# Patient Record
Sex: Female | Born: 1945 | Hispanic: No | Marital: Married | Smoking: Never smoker
Health system: Southern US, Community
[De-identification: ages and names within clinical notes are randomized; demographics above are authoritative.]

## PROBLEM LIST (undated history)

## (undated) DIAGNOSIS — I10 Essential (primary) hypertension: Secondary | ICD-10-CM

## (undated) DIAGNOSIS — I82409 Acute embolism and thrombosis of unspecified deep veins of unspecified lower extremity: Secondary | ICD-10-CM

## (undated) DIAGNOSIS — R51 Headache: Secondary | ICD-10-CM

## (undated) HISTORY — DX: Headache: R51

## (undated) HISTORY — DX: Essential (primary) hypertension: I10

## (undated) HISTORY — PX: THYROIDECTOMY, PARTIAL: SHX18

---

## 2002-01-21 ENCOUNTER — Encounter: Payer: Self-pay | Admitting: Internal Medicine

## 2002-01-21 ENCOUNTER — Encounter: Admission: RE | Admit: 2002-01-21 | Discharge: 2002-01-21 | Payer: Self-pay | Admitting: Internal Medicine

## 2002-02-08 ENCOUNTER — Encounter: Payer: Self-pay | Admitting: Internal Medicine

## 2002-02-08 ENCOUNTER — Encounter: Admission: RE | Admit: 2002-02-08 | Discharge: 2002-02-08 | Payer: Self-pay | Admitting: Internal Medicine

## 2004-12-03 ENCOUNTER — Emergency Department (HOSPITAL_COMMUNITY): Admission: EM | Admit: 2004-12-03 | Discharge: 2004-12-03 | Payer: Self-pay | Admitting: Family Medicine

## 2006-02-11 ENCOUNTER — Emergency Department (HOSPITAL_COMMUNITY): Admission: EM | Admit: 2006-02-11 | Discharge: 2006-02-11 | Payer: Self-pay | Admitting: Emergency Medicine

## 2008-02-09 ENCOUNTER — Emergency Department (HOSPITAL_COMMUNITY): Admission: EM | Admit: 2008-02-09 | Discharge: 2008-02-09 | Payer: Self-pay | Admitting: Emergency Medicine

## 2008-03-28 ENCOUNTER — Emergency Department (HOSPITAL_COMMUNITY): Admission: EM | Admit: 2008-03-28 | Discharge: 2008-03-28 | Payer: Self-pay | Admitting: Family Medicine

## 2009-12-11 IMAGING — CR DG CHEST 2V
2 series · 2 of 2 positions shown · non-contrast
Comparison: None the

CLINICAL DATA: Chest pain

CHEST - 2 VIEW

[w chest pa]
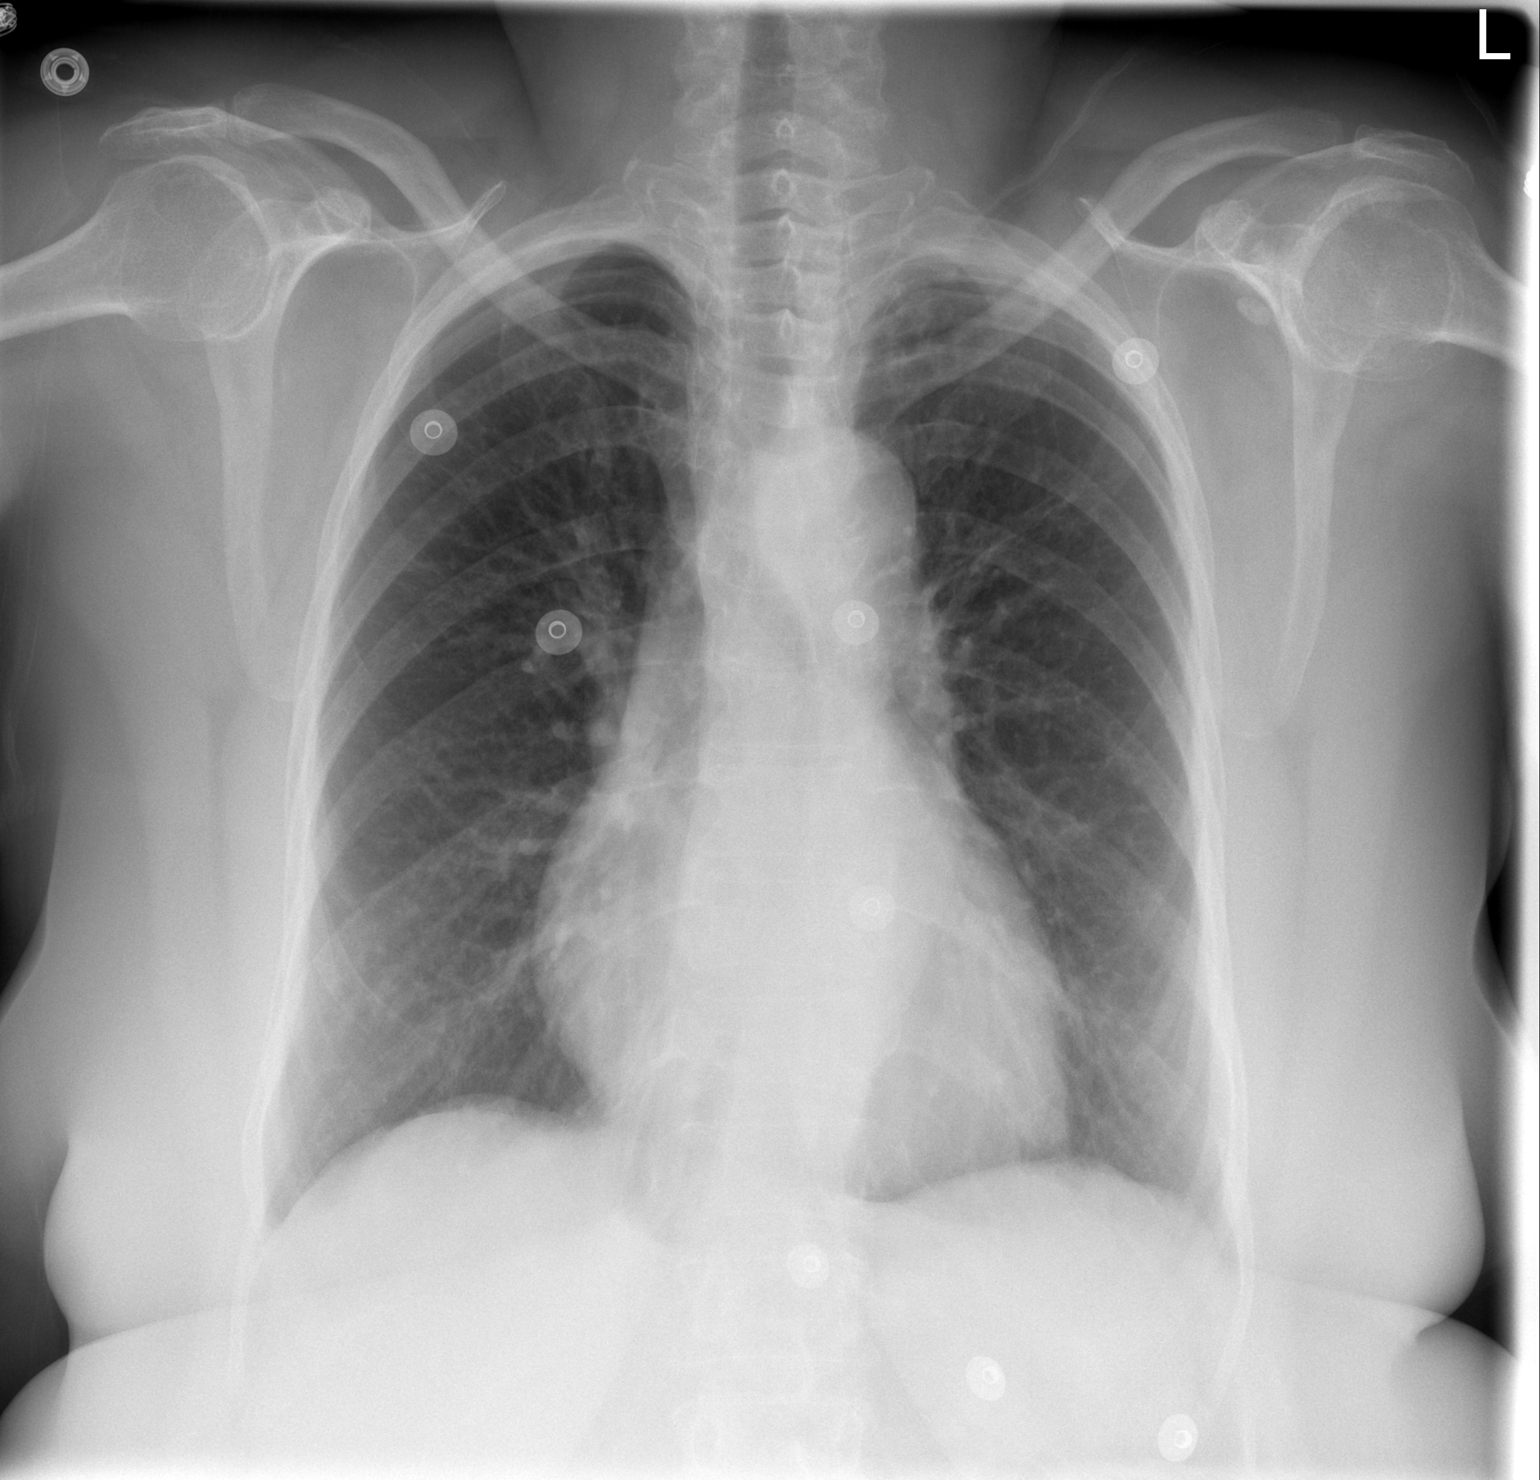

[w chest lat]
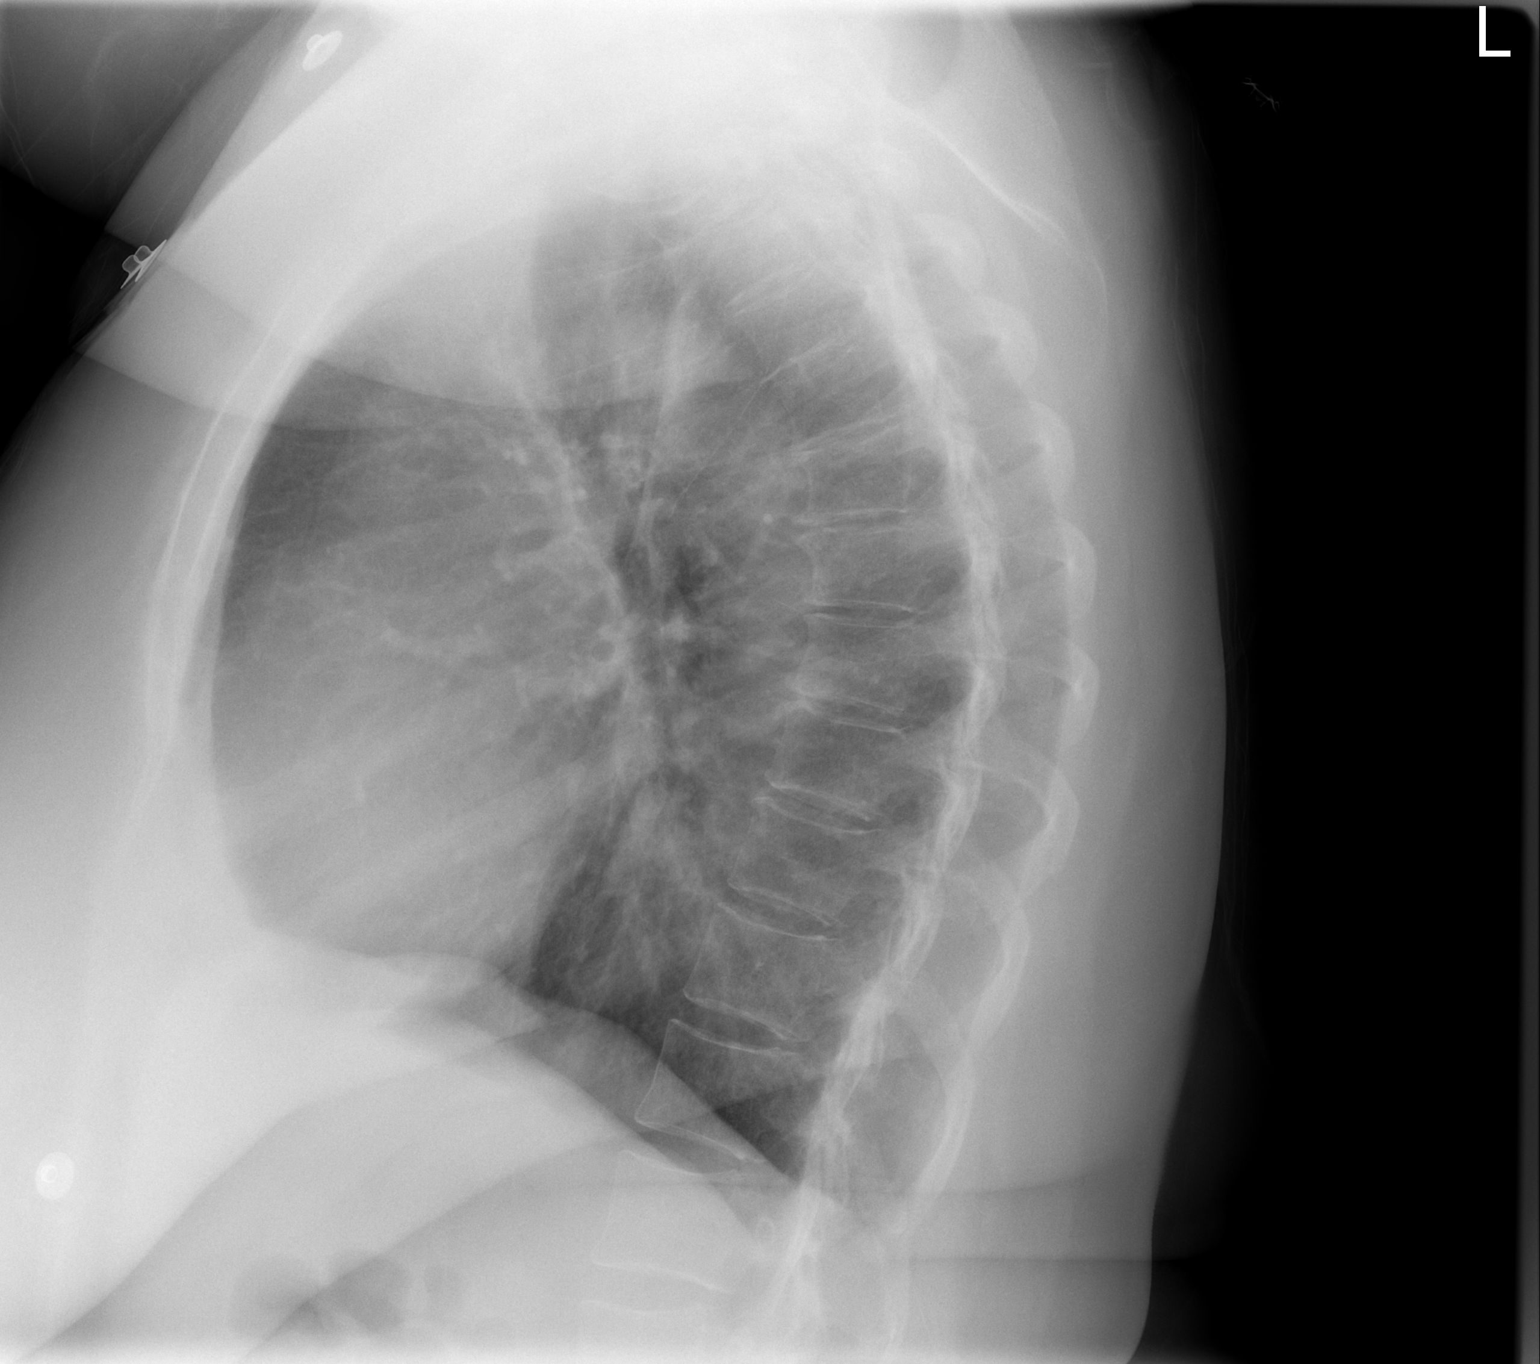

[2 of 2 positions shown; findings below may reference images not displayed]

FINDINGS: Normal mediastinum and cardiac silhouette.  Costophrenic
angles are clear.  No evidence effusion, infiltrate, or
pneumothorax. Bone island in the left scapula
IMPRESSION: No acute cardiopulmonary process.

## 2012-02-14 ENCOUNTER — Other Ambulatory Visit (HOSPITAL_COMMUNITY)
Admission: RE | Admit: 2012-02-14 | Discharge: 2012-02-14 | Disposition: A | Discharge status: Home / Self Care | Payer: Commercial Indemnity | Source: Ambulatory Visit | Attending: Internal Medicine | Admitting: Internal Medicine

## 2012-02-14 DIAGNOSIS — Z1159 Encounter for screening for other viral diseases: Secondary | ICD-10-CM | POA: Insufficient documentation

## 2012-02-14 DIAGNOSIS — Z01419 Encounter for gynecological examination (general) (routine) without abnormal findings: Secondary | ICD-10-CM | POA: Insufficient documentation

## 2013-03-08 ENCOUNTER — Ambulatory Visit: Payer: Commercial Indemnity | Admitting: Neurology

## 2013-05-12 ENCOUNTER — Encounter: Payer: Self-pay | Admitting: Neurology

## 2013-05-12 ENCOUNTER — Ambulatory Visit (INDEPENDENT_AMBULATORY_CARE_PROVIDER_SITE_OTHER): Payer: BC Managed Care – HMO | Admitting: Neurology

## 2013-05-12 VITALS — BP 136/87 | HR 83 | Temp 97.1°F | Ht 59.5 in | Wt 192.0 lb

## 2013-05-12 DIAGNOSIS — M531 Cervicobrachial syndrome: Secondary | ICD-10-CM

## 2013-05-12 DIAGNOSIS — M5481 Occipital neuralgia: Secondary | ICD-10-CM

## 2013-05-12 DIAGNOSIS — R519 Headache, unspecified: Secondary | ICD-10-CM

## 2013-05-12 DIAGNOSIS — R51 Headache: Secondary | ICD-10-CM

## 2013-05-12 NOTE — Progress Notes (Signed)
Subjective:    Patient ID: Cynthia Doyle is a 67 y.o. female.  HPI  Cynthia Foley, MD, PhD [facility] [address] [address] [place], [place] [address]  Dear Dr. Renne Doyle,   I saw your patient, Cynthia Doyle, upon your kind request in my neurologic clinic today for initial consultation of potential trigeminal neuralgia. The patient is accompanied by her husband and son today. As you know, Cynthia Doyle is a very pleasant-year-old lady with a underlying medical history of HTN, and thyroid nodule, s/p partial thyroidectomy, who has a 1 year hx of R posterior neck pain and posterior headache, retroauricular pain on the R, constant, non-throbbing, no photophobia, no hearing loss, no double vision, no N/V, describing a deep, boring pain, sensitive to touch. She was tried on carbamazepine for about 2 weeks, then Dr. Dareen Doyle in Rheumatology and was switched to prednisone 60mg  daily, which she reduce per your advise to 50 mg, but in the last 3 days, she has been taking only 5 mg daily. She has developed facial swelling since the prednisone. She has no ear pain, no tinnitus, no swallowing pain or difficulty. She had blood work done and I am going to request copies from your office. She describes a less severe pain now, 5/10 and it responds to OTC NSAIDs at this point.    Her Past Medical History Is Significant For: Past Medical History  Diagnosis Date  . HTN (hypertension)     Her Past Surgical History Is Significant For: Past Surgical History  Procedure Laterality Date  . Thyroidectomy, partial      Her Family History Is Significant For: History reviewed. No pertinent family history.  Her Social History Is Significant For: History   Social History  . Marital Status: Married    Spouse Name: N/A    Number of Children: N/A  . Years of Education: N/A   Social History Main Topics  . Smoking status: Never Smoker   . Smokeless tobacco: None  . Alcohol Use: No  . Drug  Use: No  . Sexually Active: None   Other Topics Concern  . None   Social History Narrative  . None    Her Allergies Are:  Allergies  Allergen Reactions  . Prednisone Swelling  :   Her Current Medications Are:  Outpatient Encounter Prescriptions as of 05/12/2013  Medication Sig Dispense Refill  . lisinopril-hydrochlorothiazide (PRINZIDE,ZESTORETIC) 20-25 MG per tablet Take 1 tablet by mouth daily.      . predniSONE (DELTASONE) 20 MG tablet Take 20 mg by mouth daily.       No facility-administered encounter medications on file as of 05/12/2013.    Review of Systems  Constitutional: Positive for unexpected weight change.  Cardiovascular: Positive for leg swelling.  Musculoskeletal: Positive for joint swelling and arthralgias.    Objective:  Neurologic Exam  Physical Exam Physical Examination:   Filed Vitals:   05/12/13 1023  BP: 136/87  Pulse: 83  Temp: 97.1 F (36.2 C)    General Examination: The patient is a very pleasant 67 y.o. female in no acute distress. She appears well-developed and well-nourished and well groomed. She is overweight.  HEENT: Normocephalic, atraumatic, pupils are equal, round and reactive to light and accommodation. Funduscopic exam is normal with sharp disc margins noted. Extraocular tracking is good without limitation to gaze excursion or nystagmus noted. Normal smooth pursuit is noted. Hearing is grossly intact. Tympanic membranes are clear bilaterally. Face is symmetric with normal facial  animation and normal facial sensation. Speech is clear with no dysarthria noted. There is no hypophonia. There is no lip, neck/head, jaw or voice tremor. Neck is supple with full range of passive and active motion. She has a tenderness to deep palpation in the R post neck, behind the R ear and in the back portion of the head on the R. No temporal tenderness. There are no carotid bruits on auscultation. Oropharynx exam reveals: mild mouth dryness, adequate dental  hygiene and moderate airway crowding, due to elongated tongue and narrow airway. Mallampati is class II. Tongue protrudes centrally and palate elevates symmetrically.   Chest: Clear to auscultation without wheezing, rhonchi or crackles noted.  Heart: S1+S2+0, regular and normal without murmurs, rubs or gallops noted.   Abdomen: Soft, non-tender and non-distended with normal bowel sounds appreciated on auscultation.  Extremities: There is no pitting edema in the distal lower extremities bilaterally, but has bilateral ankle puffiness, non-pitting. Pedal pulses are intact.  Skin: Warm and dry without trophic changes noted. There are no varicose veins.  Musculoskeletal: exam reveals no obvious joint deformities, tenderness or joint swelling or erythema.   Neurologically:  Mental status: The patient is awake, alert and oriented in all 4 spheres. Her memory, attention, language and knowledge are appropriate. There is no aphasia, agnosia, apraxia or anomia. Speech is clear with normal prosody and enunciation. Thought process is linear. Mood is congruent and affect is normal.  Cranial nerves are as described above under HEENT exam. In addition, shoulder shrug is normal with equal shoulder height noted. Motor exam: Normal bulk, strength and tone is noted. There is no drift, tremor or rebound. Romberg is negative. Reflexes are 2+ throughout. Toes are downgoing bilaterally. Fine motor skills are intact with normal finger taps, normal hand movements, normal rapid alternating patting, normal foot taps and normal foot agility.  Cerebellar testing shows no dysmetria or intention tremor on finger to nose testing. Heel to shin is unremarkable bilaterally. There is no truncal or gait ataxia.  Sensory exam is intact to light touch, pinprick, vibration, temperature sense and proprioception in the upper and lower extremities.  Gait, station and balance are unremarkable. No veering to one side is noted. No leaning to  one side is noted. Posture is age-appropriate and stance is narrow based. No problems turning are noted. She turns en bloc. Tandem walk is unremarkable. Intact toe and heel stance is noted.               Assessment and Plan:   Assessment and Plan:  In summary, Tecla M Saathoff is a very pleasant 67 y.o.-year old female with a history of R sided posterior head and neck pain. This is not a migrainous HA, and seems to be more in keeping with occipital neuralgia. Her physical exam is otherwise non-focal and I reassured the patient in that regard.  I had a long chat with the patient about my findings and the diagnosis of neuralgic pain, the prognosis and treatment options. We talked about medical treatments and non-pharmacological approaches.    As far as further diagnostic testing is concerned, I suggested the following today: MRI brain w and w/o Gad, MRI neck and MRA neck.  As far as medications are concerned, I recommended the following at this time: we can consider a trial of gabapentin down the road, but at this point, she has fairly good success with prn use of NSAIDs.  I answered all her questions today and the patient was in agreement  with the above outlined plan. I would like to see the patient back in 3 months, sooner if the need arises and encouraged her to call with any interim questions, concerns, problems or updates and test results.   Thank you very much for allowing me to participate in the care of this nice patient. If I can be of any further assistance to you please do not hesitate to call me at [phone].  Sincerely,   Cynthia Foley, MD, PhD

## 2013-05-12 NOTE — Patient Instructions (Addendum)
I think overall you are doing fairly well but I do want to suggest a few things today:  Please remember, common headache triggers are: sleep deprivation, dehydration, overheating, stress, hypoglycemia or skipping meals, excessive pain medications or excessive alcohol use. Some people have food triggers such as aged cheese, orange juice or chocolate, especially dark chocolate. Try to avoid these headache triggers as much possible. It may be helpful to keep a headache diary to figure out what makes your headaches worse or brings them on. Some people report headache onset after exercise but studies have shown that regular exercise may actually prevent headaches from coming on. If you have exercise-induced headaches, please make sure that you drink plenty of fluid before and after exercising and that you don't over do it.  As far as your medications are concerned, I would like to suggest no new medication at this point, we may try gabapentin down the road.    As far as diagnostic testing: MRI brain and neck, MRA head and neck  I would like to see you back in 3 months, sooner if we need to. Please call us with any interim questions, concerns, problems, updates or refill requests.  Please also call us for any test results so we can go over those with you on the phone. Brett Canales is my clinical assistant and will answer any of your questions and relay your messages to me and also relay most of my messages to you.  Our phone number is [phone]. We also have an after hours call service for urgent matters and there is a physician on-call for urgent questions. For any emergencies you know to call 911 or go to the nearest emergency room.

## 2013-05-26 ENCOUNTER — Ambulatory Visit (INDEPENDENT_AMBULATORY_CARE_PROVIDER_SITE_OTHER): Payer: BC Managed Care – HMO

## 2013-05-26 DIAGNOSIS — R51 Headache: Secondary | ICD-10-CM

## 2013-05-26 DIAGNOSIS — M5481 Occipital neuralgia: Secondary | ICD-10-CM

## 2013-05-26 DIAGNOSIS — R519 Headache, unspecified: Secondary | ICD-10-CM

## 2013-05-26 DIAGNOSIS — M531 Cervicobrachial syndrome: Secondary | ICD-10-CM

## 2013-05-27 MED ORDER — GADOPENTETATE DIMEGLUMINE 469.01 MG/ML IV SOLN: 20.0000 mL | Freq: Once | INTRAVENOUS | Status: AC | PRN

## 2013-05-28 NOTE — Progress Notes (Signed)
Quick Note:  Please advise patient that her recent scan results are in. The blood vessels in her head and her neck looked fine. Her brain scan showed mild age-related changes and 1 small area of abnormality in the left front of the brain - this is most likely a benign tumor called a meningioma. This is not a cancerous tumor or anything to particularly worry about. I do not believe it explains her headaches which are on the back side of her head. Nevertheless I think it would be a good idea for her to see a neurosurgeon just to get a consultation about this. Again this is not a cancerous tumor and there is no or minimal swelling around it. This is typically something we will monitor her by doing a repeat MRI down the Road. And again I do not believe it is a good explanation for her headaches. He she gives [place] the go ahead I would like to put in a referral to neurosurgery to get an opinion from the surgeon. Please advise me back after you speak with the patient, thx Huston Foley, MD, PhD [facility]  ______

## 2013-05-31 ENCOUNTER — Telehealth: Payer: Self-pay | Admitting: Neurology

## 2013-06-01 ENCOUNTER — Telehealth: Payer: Self-pay | Admitting: *Deleted

## 2013-06-01 NOTE — Telephone Encounter (Signed)
Called patient to let her know all  Her Scans has been sent to Dr. Frances Furbish to read.

## 2013-06-01 NOTE — Telephone Encounter (Signed)
Message copied by Salome Spotted on Tue Jun 01, 2013  9:47 AM ------      Message from: Huston Foley      Created: Fri May 28, 2013  3:20 PM       Please advise patient that her recent scan results are in. The blood vessels in her head and her neck looked fine. Her brain scan showed mild age-related changes and 1 small area of abnormality in the left front of the brain -  this is most likely a benign tumor called a meningioma. This is not a cancerous tumor or anything to particularly worry about. I do not believe it explains her headaches which are on the back side of her head. Nevertheless I think it would be a good idea for her to see a neurosurgeon just to get a consultation about this. Again this is not a cancerous tumor and there is no or minimal swelling around it. This is typically something we will monitor her by doing a repeat MRI down the Road. And again I do not believe it is a good explanation for her headaches. He she gives [place] the go ahead I would like to put in a referral to neurosurgery to get an opinion from the surgeon. Please advise me back after you speak with the patient, thx      Huston Foley, MD, PhD      [facility]       ------

## 2013-06-01 NOTE — Telephone Encounter (Signed)
Patient was ok with results she will get the second opinion from the Neurosurgeon.

## 2013-06-03 ENCOUNTER — Other Ambulatory Visit: Payer: Self-pay | Admitting: *Deleted

## 2013-06-03 ENCOUNTER — Encounter: Payer: Self-pay | Admitting: *Deleted

## 2013-06-03 DIAGNOSIS — M531 Cervicobrachial syndrome: Secondary | ICD-10-CM

## 2013-06-03 DIAGNOSIS — D329 Benign neoplasm of meninges, unspecified: Secondary | ICD-10-CM

## 2013-06-03 MED ORDER — GABAPENTIN 100 MG PO CAPS: 100.0000 mg | ORAL_CAPSULE | Freq: Every day | ORAL | Status: DC

## 2013-06-03 NOTE — Telephone Encounter (Signed)
This encounter was created in error - please disregard.

## 2013-06-09 ENCOUNTER — Telehealth: Payer: Self-pay | Admitting: Neurology

## 2013-06-09 NOTE — Telephone Encounter (Signed)
Patient was calling to see if referral was made for neuro-surgeon. I reviewed the referral and see that the referral has been made and approved for one visit on June 03 2013. She should hear from them within about a week.

## 2013-06-28 ENCOUNTER — Telehealth: Payer: Self-pay | Admitting: Neurology

## 2013-07-01 NOTE — Telephone Encounter (Signed)
Order placed to referrals, D. Handoga

## 2013-07-27 ENCOUNTER — Other Ambulatory Visit: Payer: Self-pay

## 2013-07-27 DIAGNOSIS — M531 Cervicobrachial syndrome: Secondary | ICD-10-CM

## 2013-07-27 MED ORDER — GABAPENTIN 100 MG PO CAPS: 100.0000 mg | ORAL_CAPSULE | Freq: Every day | ORAL | Status: DC

## 2013-08-05 ENCOUNTER — Telehealth: Payer: Self-pay | Admitting: *Deleted

## 2013-08-06 ENCOUNTER — Other Ambulatory Visit: Payer: Self-pay

## 2013-08-06 DIAGNOSIS — M531 Cervicobrachial syndrome: Secondary | ICD-10-CM

## 2013-08-06 MED ORDER — GABAPENTIN 100 MG PO CAPS: 100.0000 mg | ORAL_CAPSULE | Freq: Every day | ORAL | Status: DC

## 2013-08-06 NOTE — Telephone Encounter (Signed)
Patient stated that she needs a 90 day supply of gabapentin. Her insurance will not allow after 3rd order without having to pay full price.

## 2013-08-06 NOTE — Telephone Encounter (Signed)
I called patient. I will resend script for 90 days.

## 2013-09-09 ENCOUNTER — Encounter: Payer: Self-pay | Admitting: Neurology

## 2013-09-09 ENCOUNTER — Ambulatory Visit (INDEPENDENT_AMBULATORY_CARE_PROVIDER_SITE_OTHER): Payer: BC Managed Care – HMO | Admitting: Neurology

## 2013-09-09 VITALS — BP 136/95 | HR 77 | Temp 98.2°F | Ht 59.5 in | Wt 196.0 lb

## 2013-09-09 DIAGNOSIS — M6530 Trigger finger, unspecified finger: Secondary | ICD-10-CM

## 2013-09-09 DIAGNOSIS — D320 Benign neoplasm of cerebral meninges: Secondary | ICD-10-CM

## 2013-09-09 DIAGNOSIS — G4486 Cervicogenic headache: Secondary | ICD-10-CM

## 2013-09-09 DIAGNOSIS — D329 Benign neoplasm of meninges, unspecified: Secondary | ICD-10-CM

## 2013-09-09 DIAGNOSIS — R51 Headache: Secondary | ICD-10-CM

## 2013-09-09 DIAGNOSIS — M531 Cervicobrachial syndrome: Secondary | ICD-10-CM

## 2013-09-09 HISTORY — DX: Cervicogenic headache: G44.86

## 2013-09-09 MED ORDER — GABAPENTIN 100 MG PO CAPS: 100.0000 mg | ORAL_CAPSULE | Freq: Every day | ORAL | Status: DC

## 2013-09-09 NOTE — Progress Notes (Signed)
Subjective:    Patient ID: Cynthia Doyle is a 67 y.o. female.  HPI    Interim history:   Cynthia Doyle is a very pleasant 67 year-old lady with a underlying medical history of HTN, and thyroid nodule, s/p partial thyroidectomy, who presents for followup consultation of her recurrent headaches. She is unaccompanied today. I first met her on 05/12/2013, at which time I suggested further workup in the form of MRA head and neck, as well as MRI brain w/wo contrast. She reported at the time a 1 year hx of R posterior neck pain and posterior headache, retroauricular pain on the R, constant, non-throbbing, no photophobia, no hearing loss, no double vision, no N/V, describing a deep, boring pain, sensitive to touch. She was tried on carbamazepine for about 2 weeks, then saw Dr. Dareen Piano in Rheumatology and was switched to prednisone 60mg  daily, which she reduced 50 mg, then 5 mg daily. She developed facial swelling from the prednisone. She had no ear pain, no tinnitus, no swallowing pain or difficulty.  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 Her MRI brain showed mild changes of chronic microvascular ischemia. A calcified left frontal parasagittal 2 x 1.5 x 1 cm lesion likely representing a meningioma. Based on this finding I suggested a consultation with neurosurgery and made a referral in that regard. She saw Dr. Dutch Quint who suggested a 1 year FU.  I did not think the meningioma was the cause of her headaches, however. For symptomatic treatment of her headaches I suggested a trial of Neurontin, which she is now taking at 100 mg qHS and feels much improved with no SE reported. She has developed a trigger finger in her R ring finger. She has no other new complaints.   Her Past Medical History Is Significant For: Past Medical History  Diagnosis Date  . HTN  (hypertension)     Her Past Surgical History Is Significant For: Past Surgical History  Procedure Laterality Date  . Thyroidectomy, partial      Her Family History Is Significant For:  She has a special needs son, now 53 yo  Her Social History Is Significant For: History   Social History  . Marital Status: Married    Spouse Name: N/A    Number of Children: N/A  . Years of Education: N/A   Social History Main Topics  . Smoking status: Never Smoker   . Smokeless tobacco: None  . Alcohol Use: No  . Drug Use: No  . Sexual Activity: None   Other Topics Concern  . None   Social History Narrative  . None    Her Allergies Are:  Allergies  Allergen Reactions  . Prednisone Swelling  :   Her Current Medications Are:  Outpatient Encounter Prescriptions as of 09/09/2013  Medication Sig  . gabapentin (NEURONTIN) 100 MG capsule Take 1 capsule (100 mg total) by mouth at bedtime.  Marland Kitchen lisinopril-hydrochlorothiazide (PRINZIDE,ZESTORETIC) 20-25 MG per tablet Take 1 tablet by mouth daily.  . [DISCONTINUED] predniSONE (DELTASONE) 20 MG tablet Take 20 mg by mouth daily.   Review of Systems:  Out of a complete 14 point review of systems, all are reviewed and negative with the exception of these symptoms as listed below:    Review of Systems  Constitutional: Negative.   HENT: Negative.   Eyes: Negative.   Respiratory: Negative.  Cardiovascular: Negative.   Gastrointestinal: Negative.   Endocrine: Negative.   Genitourinary: Negative.   Musculoskeletal: Positive for joint swelling.  Skin: Negative.   Allergic/Immunologic: Negative.   Neurological: Negative.   Hematological: Negative.   Psychiatric/Behavioral: Negative.     Objective:  Neurologic Exam  Physical Exam Physical Examination:   Filed Vitals:   09/09/13 1146  BP: 136/95  Pulse: 77  Temp: 98.2 F (36.8 C)   General Examination: The patient is a very pleasant 67 y.o. female in no acute distress. She  appears well-developed and well-nourished and well groomed. She is overweight. She is in good spirits today.  HEENT: Normocephalic, atraumatic, pupils are equal, round and reactive to light and accommodation. Funduscopic exam is normal with sharp disc margins noted. Extraocular tracking is good without limitation to gaze excursion or nystagmus noted. Normal smooth pursuit is noted. Hearing is grossly intact. Face is symmetric with normal facial animation and normal facial sensation. Speech is clear with no dysarthria noted. There is no hypophonia. There is no lip, neck/head, jaw or voice tremor. Neck is supple with full range of passive and active motion. She has no more tenderness to deep palpation in the R post neck, behind the R ear and in the back portion of the head on the R. No temporal tenderness. There are no carotid bruits on auscultation. Oropharynx exam reveals: mild mouth dryness, adequate dental hygiene and moderate airway crowding, due to elongated tongue and narrow airway. Mallampati is class II. Tongue protrudes centrally and palate elevates symmetrically.   Chest: Clear to auscultation without wheezing, rhonchi or crackles noted.  Heart: S1+S2+0, regular and normal without murmurs, rubs or gallops noted.   Abdomen: Soft, non-tender and non-distended with normal bowel sounds appreciated on auscultation.  Extremities: There is no pitting edema in the distal lower extremities bilaterally, but has bilateral ankle puffiness, non-pitting, R more L. Pedal pulses are intact. She has evidence of right ring finger trigger finger.   Skin: Warm and dry without trophic changes noted. There are no varicose veins.  Musculoskeletal: exam reveals no obvious joint deformities, tenderness or joint swelling or erythema.   Neurologically:  Mental status: The patient is awake, alert and oriented in all 4 spheres. Her memory, attention, language and knowledge are appropriate. There is no aphasia, agnosia,  apraxia or anomia. Speech is clear with normal prosody and enunciation. Thought process is linear. Mood is congruent and affect is normal.  Cranial nerves are as described above under HEENT exam. In addition, shoulder shrug is normal with equal shoulder height noted. Motor exam: Normal bulk, strength and tone is noted. There is no drift, tremor or rebound. Romberg is negative. Reflexes are 2+ throughout. Toes are downgoing bilaterally. Fine motor skills are intact with normal finger taps, normal hand movements, normal rapid alternating patting, normal foot taps and normal foot agility.  Cerebellar testing shows no dysmetria or intention tremor on finger to nose testing. There is no truncal or gait ataxia.  Sensory exam is intact to light touch and PP in the upper and lower extremities.  Gait, station and balance are unremarkable, except for slow walk d/t body habitus. No veering to one side is noted. No leaning to one side is noted. Posture is age-appropriate and stance is narrow based. No problems turning are noted. She turns en bloc.   Assessment and Plan:   In summary, Cynthia Doyle is a very pleasant 67 y.o.-year old female with a history of R sided posterior head and  neck pain. This is not a migrainous HA, and seems to be more in keeping with occipital neuralgia versus cervicogenic headache. Her physical exam continues to be benign. She does have evidence of a left ring finger trigger finger. I explained to her that if that gets worse or starts bothering her more she may have to see a hand surgeon. Otherwise she is stable and improved with the use of gabapentin regarding her headache. She has no further tenderness in her right occipital and retroauricular areas. She has a new diagnosis of a meningioma for which she has seen neurosurgery and was advised to return in one year. I explained to her that I do not believe her meningioma is the cause of her headache or occipital pain and she was asking if the  trigger finger was the result of gabapentin which I explained to her is not likely connected. She has no side effects with gabapentin. She does not see a need to increase it and I would like to keep it at the low dose. I renewed her prescription. Today we talked about her MRI and MRA results.  I answered all her questions today and the patient was advised to return in 6 months routinely, sooner if the need arises and encouraged her to call with any interim questions, concerns, problems or updates and test results.

## 2013-09-09 NOTE — Patient Instructions (Signed)
I think overall you are doing fairly well and are stable at this point.   I do have some generic suggestions for you today:  Please make sure that you drink plenty of fluids. I would like for you to exercise daily for example in the form of walking 20-30 minutes every day, if you can. Please keep a regular sleep-wake schedule, keep regular meal times, do not skip any meals, eat  healthy snacks in between meals, such as fruit or nuts. Try to eat protein with every meal.   As far as your medications are concerned, I would like to suggest: No changes, continue with gabapentin at night. I renewed her prescription.  As far as diagnostic testing, I recommend: no new test, but for your trigger finger you may have to see a hand surgeon if it does not get better or especially if it gets worse.  Engage in social activities in your community and with your family and try to keep up with current events by reading the newspaper or watching the news.  I do not think we need to make any changes in your medications at this point. I think you're stable enough that I can see you back in 6 months, sooner if we need to. Please call us if you have any interim questions, concerns, or problems or updates to need to discuss.  Brett Canales is my clinical assistant and will answer any of your questions and relay your messages to me and will give you my messages.   Our phone number is [phone]. We also have an after hours call service for urgent matters and there is a physician on-call for urgent questions. For any emergencies you know to call 911 or go to the nearest emergency room.

## 2014-01-18 ENCOUNTER — Telehealth: Payer: Self-pay | Admitting: *Deleted

## 2014-01-18 DIAGNOSIS — R519 Headache, unspecified: Secondary | ICD-10-CM

## 2014-01-18 DIAGNOSIS — M6530 Trigger finger, unspecified finger: Secondary | ICD-10-CM

## 2014-01-18 DIAGNOSIS — D329 Benign neoplasm of meninges, unspecified: Secondary | ICD-10-CM

## 2014-01-18 DIAGNOSIS — R51 Headache: Secondary | ICD-10-CM

## 2014-01-18 DIAGNOSIS — G4486 Cervicogenic headache: Secondary | ICD-10-CM

## 2014-01-18 DIAGNOSIS — M531 Cervicobrachial syndrome: Secondary | ICD-10-CM

## 2014-01-18 MED ORDER — GABAPENTIN 100 MG PO CAPS: 300.0000 mg | ORAL_CAPSULE | Freq: Every day | ORAL | Status: AC

## 2014-01-18 NOTE — Telephone Encounter (Signed)
Please advise patient that we can gradually increase the gabapentin. She can take 2 pills each night for the next 2 weeks and then increase it to 3 pills each night. She is on a very low dose and I started her on a cautious dose.

## 2014-01-18 NOTE — Telephone Encounter (Signed)
Spoke with patient and she said that the medicationgabapentin (NEURONTIN) 100 MG capsule is not helping, still having headaches every night. The right side starting with the neck and behind the ear.Is there anything else she may try? -

## 2014-01-18 NOTE — Telephone Encounter (Signed)
I called pt and let her know recommendations as per below.  She will try.  Has f/u appt 03-10-14.  Will call back as needed.   She verbalized understanding.

## 2014-03-10 ENCOUNTER — Ambulatory Visit: Payer: BC Managed Care – HMO | Admitting: Neurology

## 2014-03-18 DIAGNOSIS — D72819 Decreased white blood cell count, unspecified: Secondary | ICD-10-CM | POA: Diagnosis not present

## 2014-03-18 DIAGNOSIS — I10 Essential (primary) hypertension: Secondary | ICD-10-CM | POA: Diagnosis not present

## 2014-03-18 DIAGNOSIS — Z0000 Encounter for general adult medical examination without abnormal findings: Secondary | ICD-10-CM | POA: Diagnosis not present

## 2014-03-24 DIAGNOSIS — Z23 Encounter for immunization: Secondary | ICD-10-CM | POA: Diagnosis not present

## 2014-03-24 DIAGNOSIS — D72819 Decreased white blood cell count, unspecified: Secondary | ICD-10-CM | POA: Diagnosis not present

## 2014-03-24 DIAGNOSIS — I10 Essential (primary) hypertension: Secondary | ICD-10-CM | POA: Diagnosis not present

## 2014-03-24 DIAGNOSIS — G500 Trigeminal neuralgia: Secondary | ICD-10-CM | POA: Diagnosis not present

## 2014-03-24 DIAGNOSIS — M316 Other giant cell arteritis: Secondary | ICD-10-CM | POA: Diagnosis not present

## 2014-07-18 ENCOUNTER — Ambulatory Visit: Payer: BC Managed Care – HMO | Admitting: Neurology

## 2014-09-20 DIAGNOSIS — M5481 Occipital neuralgia: Secondary | ICD-10-CM | POA: Diagnosis not present

## 2014-09-21 DIAGNOSIS — R220 Localized swelling, mass and lump, head: Secondary | ICD-10-CM | POA: Diagnosis not present

## 2014-09-21 DIAGNOSIS — D329 Benign neoplasm of meninges, unspecified: Secondary | ICD-10-CM | POA: Diagnosis not present

## 2014-09-22 DIAGNOSIS — R749 Abnormal serum enzyme level, unspecified: Secondary | ICD-10-CM | POA: Diagnosis not present

## 2014-09-22 DIAGNOSIS — M5481 Occipital neuralgia: Secondary | ICD-10-CM | POA: Diagnosis not present

## 2014-09-27 DIAGNOSIS — G500 Trigeminal neuralgia: Secondary | ICD-10-CM | POA: Diagnosis not present

## 2014-09-27 DIAGNOSIS — R7309 Other abnormal glucose: Secondary | ICD-10-CM | POA: Diagnosis not present

## 2014-09-27 DIAGNOSIS — I10 Essential (primary) hypertension: Secondary | ICD-10-CM | POA: Diagnosis not present

## 2014-11-14 ENCOUNTER — Ambulatory Visit (INDEPENDENT_AMBULATORY_CARE_PROVIDER_SITE_OTHER): Payer: Medicare Other | Admitting: Neurology

## 2014-11-14 ENCOUNTER — Encounter: Payer: Self-pay | Admitting: Neurology

## 2014-11-14 VITALS — BP 159/90 | HR 69 | Temp 97.2°F | Ht 59.5 in | Wt 187.0 lb

## 2014-11-14 DIAGNOSIS — G4486 Cervicogenic headache: Secondary | ICD-10-CM

## 2014-11-14 DIAGNOSIS — R51 Headache: Secondary | ICD-10-CM | POA: Diagnosis not present

## 2014-11-14 DIAGNOSIS — M25512 Pain in left shoulder: Secondary | ICD-10-CM | POA: Diagnosis not present

## 2014-11-14 DIAGNOSIS — I10 Essential (primary) hypertension: Secondary | ICD-10-CM | POA: Diagnosis not present

## 2014-11-14 DIAGNOSIS — D329 Benign neoplasm of meninges, unspecified: Secondary | ICD-10-CM | POA: Diagnosis not present

## 2014-11-14 NOTE — Patient Instructions (Addendum)
Neurologically, you look well. You can follow up with me as needed.   Please remember, common headache triggers are: sleep deprivation, dehydration, overheating, stress, hypoglycemia or skipping meals and blood sugar fluctuations, excessive pain medications or excessive alcohol use or caffeine withdrawal. Some people have food triggers such as aged cheese, orange juice or chocolate, especially dark chocolate, or MSG (monosodium glutamate). Try to avoid these headache triggers as much possible. It may be helpful to keep a headache diary to figure out what makes your headaches worse or brings them on and what alleviates them. Some people report headache onset after exercise but studies have shown that regular exercise may actually prevent headaches from coming. If you have exercise-induced headaches, please make sure that you drink plenty of fluid before and after exercising and that you do not over do it and do not overheat.  For your left shoulder pain, please see your primary doctor or go to urgent care. You may have injured your rotator cuff.

## 2014-11-14 NOTE — Progress Notes (Signed)
Subjective:    Patient ID: Cynthia Doyle is a 69 y.o. female.  HPI     Interim history:  Cynthia Doyle is a very pleasant 69 year-old lady with a underlying medical history of HTN, and thyroid nodule, s/p partial thyroidectomy, who presents for followup consultation of her recurrent headaches. She is unaccompanied today. I last saw her on 09/09/2013, at which time I suggested she continue low-dose gabapentin. She called on 01/18/2014 reporting that she had worsening of her headaches. I suggested she increase gabapentin to 300 mg gradually.  Today, she reports that she stays in [facility] for 6 months and her husband (61 yo) lives there. She has 4 children of her own, 3 live in the area and one in [place]. She has been doing better with her headaches. She takes gabapentin prn, 1-2 pills a night, none in the last 1 1/2 weeks. She had another brain MRI in 12/15, per Dr. Trenton Gammon, which was stable, per patient, but I do not have the results available for review. She has been having L shoulder pain for the past 5 days with decrease in ROM. She has been using a heat pad and ibuprofen. She was working quite hard physically when she was in [place] as they had a lot of damage to their home and a lot of work to do. She was cleaning up a lot. She had lost weight because of her physical activity there. She regained some weight when she came back here.  I first met her on 05/12/2013, at which time I suggested further workup in the form of MRA head and neck, as well as MRI brain w/wo contrast. She reported at the time a 1 year hx of R posterior neck pain and posterior headache, retroauricular pain on the R, constant, non-throbbing, no photophobia, no hearing loss, no double vision, no N/V, describing a deep, boring pain, sensitive to touch. She was tried on carbamazepine for about 2 weeks, then saw Dr. Ouida Sills in Rheumatology and was switched to prednisone 31m daily, which she reduced 50 mg, then 5 mg daily. She developed facial  swelling from the prednisone. She had no ear pain, no tinnitus, no swallowing pain or difficulty.   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 Her MRI brain showed mild changes of chronic microvascular ischemia. A calcified left frontal parasagittal 2 x 1.5 x 1 cm lesion likely representing a meningioma. Based on this finding I suggested a consultation with neurosurgery and made a referral in that regard. She saw Dr. PTrenton Gammonwho suggested a 1 year FU.   I did not think the meningioma was the cause of her headaches, however. For symptomatic treatment of her headaches I suggested a trial of Neurontin.   Her Past Medical History Is Significant For: Past Medical History  Diagnosis Date  . HTN (hypertension)   . Cervicogenic headache 09/09/2013    Her Past Surgical History Is Significant For: Past Surgical History  Procedure Laterality Date  . Thyroidectomy, partial      Her Family History Is Significant For: Family History  Problem Relation Age of Onset  . Healthy Sister   . Healthy Brother     Her Social History Is Significant For: History   Social History  . Marital Status: Married    Spouse Name: N/A    Number of Children: N/A  .  Years of Education: N/A   Social History Main Topics  . Smoking status: Never Smoker   . Smokeless tobacco: None  . Alcohol Use: No  . Drug Use: No  . Sexual Activity: None   Other Topics Concern  . None   Social History Narrative    Her Allergies Are:  Allergies  Allergen Reactions  . Prednisone Swelling  :   Her Current Medications Are:  Outpatient Encounter Prescriptions as of 11/14/2014  Medication Sig  . gabapentin (NEURONTIN) 100 MG capsule Take 3 capsules (300 mg total) by mouth at bedtime.  Marland Kitchen lisinopril-hydrochlorothiazide (PRINZIDE,ZESTORETIC) 20-25 MG per tablet Take 1 tablet by  mouth daily.  :  Review of Systems:  Out of a complete 14 point review of systems, all are reviewed and negative with the exception of these symptoms as listed below:   Review of Systems L shoulder pain, used heat pad, ibuprofen.   Objective:  Neurologic Exam  Physical Exam Physical Examination:   Filed Vitals:   11/14/14 1419  BP: 159/90  Pulse: 69  Temp:    General Examination: The patient is a very pleasant 69 y.o. female in no acute distress. She appears well-developed and well-nourished and well groomed. She is overweight. She is in good spirits today, but complains of left shoulder pain.  HEENT: Normocephalic, atraumatic, pupils are equal, round and reactive to light and accommodation. Funduscopic exam is normal with sharp disc margins noted. Extraocular tracking is good without limitation to gaze excursion or nystagmus noted. Normal smooth pursuit is noted. She has mild bilateral cataracts. Hearing is grossly intact. Face is symmetric with normal facial animation and normal facial sensation. Speech is clear with no dysarthria noted. There is no hypophonia. There is no lip, neck/head, jaw or voice tremor. Neck is supple with full range of passive and active motion. She has no tenderness to deep palpation in the neck or base of skull. No temporal tenderness. There are no carotid bruits on auscultation. Oropharynx exam reveals: mild mouth dryness, adequate dental hygiene and moderate airway crowding, due to elongated tongue and narrow airway. Mallampati is class II. Tongue protrudes centrally and palate elevates symmetrically.   Chest: Clear to auscultation without wheezing, rhonchi or crackles noted.  Heart: S1+S2+0, regular and normal without murmurs, rubs or gallops noted.   Abdomen: Soft, non-tender and non-distended with normal bowel sounds appreciated on auscultation.  Extremities: There is no pitting edema in the distal lower extremities bilaterally, but has bilateral ankle  puffiness, non-pitting, unchanged. Pedal pulses are intact.    Skin: Warm and dry without trophic changes noted. There are no varicose veins.  Musculoskeletal: exam reveals no obvious joint deformities, tenderness or joint swelling or erythema with the exception of significant left shoulder tenderness upon palpation of the joint as well as significant decrease in range of motion. It is not dislocated appearing.   Neurologically:  Mental status: The patient is awake, alert and oriented in all 4 spheres. Her memory, attention, language and knowledge are appropriate. There is no aphasia, agnosia, apraxia or anomia. Speech is clear with normal prosody and enunciation. Thought process is linear. Mood is congruent and affect is normal.  Cranial nerves are as described above under HEENT exam. In addition, shoulder shrug is normal with equal shoulder height noted. Motor exam: Normal bulk, strength and tone is noted. There is no drift, tremor or rebound. Romberg is negative. Reflexes are 2+ throughout. Toes are downgoing bilaterally. Fine motor skills are intact with normal finger taps,  normal hand movements, normal rapid alternating patting, normal foot taps and normal foot agility. Because of her left shoulder pain she has decrease in range of motion and difficulty doing finger to nose testing.  Cerebellar testing shows no dysmetria or intention tremor on finger to nose testing on the R. There is no truncal or gait ataxia.  Sensory exam is intact to light touch and PP in the upper and lower extremities.  Gait, station and balance are unremarkable, except for slow walk d/t body habitus. No veering to one side is noted. No leaning to one side is noted. Posture is age-appropriate and stance is narrow based. No problems turning are noted. She turns en bloc.   Assessment and Plan:   In summary, Cynthia Doyle is a very pleasant 69 year old female with a history of R sided posterior head and neck pain. This is not a  migrainous HA, and seems to be more in keeping with occipital neuralgia versus cervicogenic headaches. She has improved in that regard. She apparently had a repeat brain MRI in December which per her report was stable. She had seen Dr. Trenton Gammon in neurosurgery for this. She has a history of meningioma which apparently has been stable. Neurologically, she has done better but recently she started having left shoulder pain and has significant left shoulder tenderness today and decrease in range of motion. She is advised to seek immediate attention for this by going to urgent care or the emergency room. Her headaches have essentially resolved. She is taking gabapentin as needed. I suggested an as needed follow-up with me. We talked about potential headache triggers again today.  I answered all her questions today and the patient was in agreement.

## 2014-11-15 DIAGNOSIS — M19012 Primary osteoarthritis, left shoulder: Secondary | ICD-10-CM | POA: Diagnosis not present

## 2014-11-15 DIAGNOSIS — M25512 Pain in left shoulder: Secondary | ICD-10-CM | POA: Diagnosis not present

## 2015-02-09 DIAGNOSIS — M19012 Primary osteoarthritis, left shoulder: Secondary | ICD-10-CM | POA: Diagnosis not present

## 2015-05-04 DIAGNOSIS — R7309 Other abnormal glucose: Secondary | ICD-10-CM | POA: Diagnosis not present

## 2015-05-04 DIAGNOSIS — I10 Essential (primary) hypertension: Secondary | ICD-10-CM | POA: Diagnosis not present

## 2015-05-10 DIAGNOSIS — I10 Essential (primary) hypertension: Secondary | ICD-10-CM | POA: Diagnosis not present

## 2015-05-10 DIAGNOSIS — G500 Trigeminal neuralgia: Secondary | ICD-10-CM | POA: Diagnosis not present

## 2015-05-10 DIAGNOSIS — Z1212 Encounter for screening for malignant neoplasm of rectum: Secondary | ICD-10-CM | POA: Diagnosis not present

## 2015-05-10 DIAGNOSIS — M7989 Other specified soft tissue disorders: Secondary | ICD-10-CM | POA: Diagnosis not present

## 2015-05-10 DIAGNOSIS — R7309 Other abnormal glucose: Secondary | ICD-10-CM | POA: Diagnosis not present

## 2015-11-14 DIAGNOSIS — R7309 Other abnormal glucose: Secondary | ICD-10-CM | POA: Diagnosis not present

## 2015-11-14 DIAGNOSIS — E7800 Pure hypercholesterolemia, unspecified: Secondary | ICD-10-CM | POA: Diagnosis not present

## 2015-11-17 DIAGNOSIS — I10 Essential (primary) hypertension: Secondary | ICD-10-CM | POA: Diagnosis not present

## 2015-11-17 DIAGNOSIS — R7309 Other abnormal glucose: Secondary | ICD-10-CM | POA: Diagnosis not present

## 2016-07-12 DIAGNOSIS — I10 Essential (primary) hypertension: Secondary | ICD-10-CM | POA: Diagnosis not present

## 2016-07-12 DIAGNOSIS — R51 Headache: Secondary | ICD-10-CM | POA: Diagnosis not present

## 2016-07-12 DIAGNOSIS — N390 Urinary tract infection, site not specified: Secondary | ICD-10-CM | POA: Diagnosis not present

## 2016-07-13 DIAGNOSIS — I10 Essential (primary) hypertension: Secondary | ICD-10-CM | POA: Diagnosis not present

## 2016-07-13 DIAGNOSIS — R51 Headache: Secondary | ICD-10-CM | POA: Diagnosis not present

## 2016-09-06 DIAGNOSIS — H0012 Chalazion right lower eyelid: Secondary | ICD-10-CM | POA: Diagnosis not present

## 2017-03-11 DIAGNOSIS — M8588 Other specified disorders of bone density and structure, other site: Secondary | ICD-10-CM | POA: Diagnosis not present

## 2017-03-11 DIAGNOSIS — E559 Vitamin D deficiency, unspecified: Secondary | ICD-10-CM | POA: Diagnosis not present

## 2017-03-11 DIAGNOSIS — I10 Essential (primary) hypertension: Secondary | ICD-10-CM | POA: Diagnosis not present

## 2017-03-17 DIAGNOSIS — I10 Essential (primary) hypertension: Secondary | ICD-10-CM | POA: Diagnosis not present

## 2017-03-17 DIAGNOSIS — Z780 Asymptomatic menopausal state: Secondary | ICD-10-CM | POA: Diagnosis not present

## 2017-03-17 DIAGNOSIS — G500 Trigeminal neuralgia: Secondary | ICD-10-CM | POA: Diagnosis not present

## 2017-03-17 DIAGNOSIS — M1712 Unilateral primary osteoarthritis, left knee: Secondary | ICD-10-CM | POA: Diagnosis not present

## 2017-03-19 DIAGNOSIS — Z1231 Encounter for screening mammogram for malignant neoplasm of breast: Secondary | ICD-10-CM | POA: Diagnosis not present

## 2017-03-27 DIAGNOSIS — Z0000 Encounter for general adult medical examination without abnormal findings: Secondary | ICD-10-CM | POA: Diagnosis not present

## 2017-03-27 DIAGNOSIS — R5383 Other fatigue: Secondary | ICD-10-CM | POA: Diagnosis not present

## 2017-04-01 DIAGNOSIS — M1712 Unilateral primary osteoarthritis, left knee: Secondary | ICD-10-CM | POA: Diagnosis not present

## 2017-10-31 DIAGNOSIS — R9431 Abnormal electrocardiogram [ECG] [EKG]: Secondary | ICD-10-CM | POA: Diagnosis not present

## 2017-10-31 DIAGNOSIS — I10 Essential (primary) hypertension: Secondary | ICD-10-CM | POA: Diagnosis not present

## 2017-11-11 DIAGNOSIS — I10 Essential (primary) hypertension: Secondary | ICD-10-CM | POA: Diagnosis not present

## 2019-12-09 ENCOUNTER — Ambulatory Visit: Payer: Commercial Indemnity | Attending: Internal Medicine

## 2019-12-09 DIAGNOSIS — Z23 Encounter for immunization: Secondary | ICD-10-CM | POA: Insufficient documentation

## 2019-12-09 NOTE — Progress Notes (Signed)
   Covid-19 Vaccination Clinic  Name:  Cynthia Doyle    MRN: [id number] DOB: 17-May-1946  12/09/2019  Cynthia Doyle was observed post Covid-19 immunization for 15 minutes without incident. She was provided with Vaccine Information Sheet and instruction to access the V-Safe system.   Cynthia Doyle was instructed to call 911 with any severe reactions post vaccine: Marland Kitchen Difficulty breathing  . Swelling of face and throat  . A fast heartbeat  . A bad rash all over body  . Dizziness and weakness   Immunizations Administered    Name Date Dose VIS Date Route   Pfizer COVID-19 Vaccine 12/09/2019  9:53 AM 0.3 mL 09/17/2019 Intramuscular   Manufacturer: [place]   Lot: [id number]   [place]: [id number]

## 2020-01-05 ENCOUNTER — Ambulatory Visit: Payer: Commercial Indemnity | Attending: Internal Medicine

## 2020-01-05 DIAGNOSIS — Z23 Encounter for immunization: Secondary | ICD-10-CM

## 2020-01-05 NOTE — Progress Notes (Signed)
   Covid-19 Vaccination Clinic  Name:  Cynthia Doyle    MRN: [id number] DOB: [birth year]  01/05/2020  Cynthia Doyle was observed post Covid-19 immunization for 15 minutes without incident. She was provided with Vaccine Information Sheet and instruction to access the V-Safe system.   Cynthia Doyle was instructed to call 911 with any severe reactions post vaccine: Marland Kitchen Difficulty breathing  . Swelling of face and throat  . A fast heartbeat  . A bad rash all over body  . Dizziness and weakness   Immunizations Administered    Name Date Dose VIS Date Route   Pfizer COVID-19 Vaccine 01/05/2020 11:13 AM 0.3 mL 09/17/2019 Intramuscular   Manufacturer: [employer]   Lot: [id number]   [place]: [id number]

## 2022-01-14 ENCOUNTER — Emergency Department (HOSPITAL_BASED_OUTPATIENT_CLINIC_OR_DEPARTMENT_OTHER)
Admission: EM | Admit: 2022-01-14 | Discharge: 2022-01-14 | Disposition: A | Discharge status: Home / Self Care | Payer: Medicare Other | Attending: Emergency Medicine | Admitting: Emergency Medicine

## 2022-01-14 ENCOUNTER — Emergency Department (HOSPITAL_BASED_OUTPATIENT_CLINIC_OR_DEPARTMENT_OTHER): Payer: Medicare Other

## 2022-01-14 ENCOUNTER — Encounter (HOSPITAL_BASED_OUTPATIENT_CLINIC_OR_DEPARTMENT_OTHER): Payer: Self-pay | Admitting: Obstetrics and Gynecology

## 2022-01-14 ENCOUNTER — Other Ambulatory Visit: Payer: Self-pay

## 2022-01-14 DIAGNOSIS — R609 Edema, unspecified: Secondary | ICD-10-CM

## 2022-01-14 DIAGNOSIS — R600 Localized edema: Secondary | ICD-10-CM | POA: Diagnosis present

## 2022-01-14 DIAGNOSIS — Z86718 Personal history of other venous thrombosis and embolism: Secondary | ICD-10-CM | POA: Insufficient documentation

## 2022-01-14 DIAGNOSIS — R2243 Localized swelling, mass and lump, lower limb, bilateral: Secondary | ICD-10-CM | POA: Insufficient documentation

## 2022-01-14 DIAGNOSIS — Z7901 Long term (current) use of anticoagulants: Secondary | ICD-10-CM | POA: Insufficient documentation

## 2022-01-14 DIAGNOSIS — M7989 Other specified soft tissue disorders: Secondary | ICD-10-CM | POA: Insufficient documentation

## 2022-01-14 HISTORY — DX: Acute embolism and thrombosis of unspecified deep veins of unspecified lower extremity: I82.409

## 2022-01-14 LAB — BASIC METABOLIC PANEL
Anion gap: 11 (ref 5–15)
BUN: 19 mg/dL (ref 8–23)
CO2: 23 mmol/L (ref 22–32)
Calcium: 9.1 mg/dL (ref 8.9–10.3)
Chloride: 102 mmol/L (ref 98–111)
Creatinine, Ser: 0.71 mg/dL (ref 0.44–1.00)
GFR, Estimated: >60 mL/min (ref >60)
Glucose, Bld: 99 mg/dL (ref 70–99)
Potassium: 3.7 mmol/L (ref 3.5–5.1)
Sodium: 136 mmol/L (ref 135–145)

## 2022-01-14 LAB — CBC
HCT: 37.6 % (ref 36.0–46.0)
Hemoglobin: 12.2 g/dL (ref 12.0–15.0)
MCH: 30.6 pg (ref 26.0–34.0)
MCHC: 32.4 g/dL (ref 30.0–36.0)
MCV: 94.2 fL (ref 80.0–100.0)
Platelets: 186 10*3/uL (ref 150–400)
RBC: 3.99 MIL/uL (ref 3.87–5.11)
RDW: 14.4 % (ref 11.5–15.5)
WBC: 4.3 10*3/uL (ref 4.0–10.5)
nRBC: 0 % (ref 0.0–0.2)

## 2022-01-14 LAB — PROTIME-INR
INR: 1.2 (ref 0.8–1.2)
Prothrombin Time: 15 seconds (ref 11.4–15.2)

## 2022-01-14 LAB — BRAIN NATRIURETIC PEPTIDE: B Natriuretic Peptide: 85.4 pg/mL (ref 0.0–100.0)

## 2022-01-14 NOTE — Discharge Instructions (Addendum)
Increase your furosemide dosage to double for the next 3 days.  And then revert back to your normal dose. ? ?Follow-up with your primary care doctor this week.  Return back to the ER if you have difficulty breathing fevers worsening symptoms or any additional concerns. ?

## 2022-01-14 NOTE — ED Notes (Signed)
Discharge instructions, medications, and follow up care reviewed and explained.  ?

## 2022-01-14 NOTE — ED Triage Notes (Signed)
Patient reports to the ER for leg swelling patient reports bilateral leg swelling x3-4 weeks. Endorses head to the leg and pain and swelling. Patient reports she is from [place] where she cares for her son who is handicapped ?

## 2022-01-14 NOTE — ED Provider Notes (Signed)
?[facility] ?Provider Note ? ? ?CSN: [id number] ?Arrival date & time: 01/14/22  1634 ? ?  ? ?History ? ?Chief Complaint  ?Patient presents with  ? Leg Swelling  ? ? ?WILMARIE SPARLIN is a 76 y.o. female. ? ?Patient presents to ER chief complaint bilateral leg swelling ongoing for the past month.  She states that she had intermittent swelling in the past.  Appears worse in the last month or so.  Otherwise denies any fevers or cough or vomiting or diarrhea no headache or chest pain or difficulty breathing.  She has a history of lower lower extremity DVT and has been taking anticoagulation medication for this. ? ? ?  ? ?Home Medications ?Prior to Admission medications   ?Medication Sig Start Date End Date Taking? Authorizing Provider  ?gabapentin (NEURONTIN) 100 MG capsule Take 3 capsules (300 mg total) by mouth at bedtime. 01/18/14   Star Age, MD  ?lisinopril-hydrochlorothiazide (PRINZIDE,ZESTORETIC) 20-25 MG per tablet Take 1 tablet by mouth daily.    [provider]  ?   ? ?Allergies    ?Prednisone   ? ?Review of Systems   ?Review of Systems  ?Constitutional:  Negative for fever.  ?HENT:  Negative for ear pain.   ?Eyes:  Negative for pain.  ?Respiratory:  Negative for cough.   ?Cardiovascular:  Negative for chest pain.  ?Gastrointestinal:  Negative for abdominal pain.  ?Genitourinary:  Negative for flank pain.  ?Musculoskeletal:  Negative for back pain.  ?Skin:  Negative for rash.  ?Neurological:  Negative for headaches.  ? ?Physical Exam ?Updated Vital Signs ?BP (!) 173/87   Pulse 70   Temp 98.1 ?F (36.7 ?C)   Resp 18   Ht '4\' 10"'$  (1.473 m)   Wt 82.6 kg   SpO2 100%   BMI 38.04 kg/m?  ?Physical Exam ?Constitutional:   ?   General: She is not in acute distress. ?   Appearance: Normal appearance.  ?HENT:  ?   Head: Normocephalic.  ?   Nose: Nose normal.  ?Eyes:  ?   Extraocular Movements: Extraocular movements intact.  ?Cardiovascular:  ?   Rate and Rhythm: Normal rate.   ?Pulmonary:  ?   Effort: Pulmonary effort is normal.  ?Musculoskeletal:     ?   General: Normal range of motion.  ?   Cervical back: Normal range of motion.  ?   Right lower leg: Edema present.  ?   Left lower leg: Edema present.  ?Neurological:  ?   General: No focal deficit present.  ?   Mental Status: She is alert. Mental status is at baseline.  ? ? ?ED Results / Procedures / Treatments   ?Labs ?(all labs ordered are listed, but only abnormal results are displayed) ?Labs Reviewed  ?BASIC METABOLIC PANEL  ?CBC  ?PROTIME-INR  ?BRAIN NATRIURETIC PEPTIDE  ? ? ?EKG ?None ? ?Radiology ?US Venous Img Lower Bilateral (DVT) ? ?Result Date: 01/14/2022 ?CLINICAL DATA:  Bilateral leg swelling and pain EXAM: Bilateral LOWER EXTREMITY VENOUS DOPPLER ULTRASOUND TECHNIQUE: Gray-scale sonography with compression, as well as color and duplex ultrasound, were performed to evaluate the deep venous system(s) from the level of the common femoral vein through the popliteal and proximal calf veins. COMPARISON:  None. FINDINGS: VENOUS Normal compressibility of the common femoral, superficial femoral, and popliteal veins, as well as the visualized calf veins. Visualized portions of profunda femoral vein and great saphenous vein unremarkable. No filling defects to suggest DVT on grayscale or color Doppler  imaging. Doppler waveforms show normal direction of venous flow, normal respiratory plasticity and response to augmentation. OTHER None. Limitations: none IMPRESSION: Negative. Electronically Signed   By: Donavan Foil M.D.   On: 01/14/2022 20:02   ? ?Procedures ?Procedures  ? ? ?Medications Ordered in ED ?Medications - No data to display ? ?ED Course/ Medical Decision Making/ A&P ?  ?                        ?Medical Decision Making ?Amount and/or Complexity of Data Reviewed ?Labs: ordered. ? ? ?Patient maintained on cardiac monitor, sinus rhythm noted. ? ?Chart review shows encounter for immunization 2 years ago. ? ?Work-up today  includes labs CBC CMP.  INR normal 1.2.  proBNP normal.  White count normal hemoglobin normal.  Ultrasound bilateral lower extremity shows no evidence of DVT. ? ?Given these findings patient advised outpatient follow-up with her doctor this week.  Advised her to increase her Lasix for the next 3 days.  Advised immediate return for worsening symptoms fevers pain or any additional concerns. ? ? ? ? ? ? ? ?Final Clinical Impression(s) / ED Diagnoses ?Final diagnoses:  ?Edema, unspecified type  ? ? ?Rx / DC Orders ?ED Discharge Orders   ? ? None  ? ?  ? ? ?  ?Luna Fuse, MD ?01/14/22 2042 ? ?

## 2022-02-04 ENCOUNTER — Other Ambulatory Visit: Payer: Self-pay

## 2022-02-04 ENCOUNTER — Emergency Department (HOSPITAL_BASED_OUTPATIENT_CLINIC_OR_DEPARTMENT_OTHER)
Admission: EM | Admit: 2022-02-04 | Discharge: 2022-02-04 | Disposition: A | Discharge status: Home / Self Care | Payer: Medicare Other | Attending: Emergency Medicine | Admitting: Emergency Medicine

## 2022-02-04 ENCOUNTER — Encounter (HOSPITAL_BASED_OUTPATIENT_CLINIC_OR_DEPARTMENT_OTHER): Payer: Self-pay

## 2022-02-04 DIAGNOSIS — R350 Frequency of micturition: Secondary | ICD-10-CM | POA: Insufficient documentation

## 2022-02-04 DIAGNOSIS — Z7901 Long term (current) use of anticoagulants: Secondary | ICD-10-CM | POA: Diagnosis not present

## 2022-02-04 DIAGNOSIS — R300 Dysuria: Secondary | ICD-10-CM

## 2022-02-04 DIAGNOSIS — R319 Hematuria, unspecified: Secondary | ICD-10-CM | POA: Insufficient documentation

## 2022-02-04 DIAGNOSIS — Z86718 Personal history of other venous thrombosis and embolism: Secondary | ICD-10-CM | POA: Insufficient documentation

## 2022-02-04 LAB — URINALYSIS, MICROSCOPIC (REFLEX)
RBC / HPF: >50 RBC/hpf (ref 0–5)
WBC, UA: >50 WBC/hpf (ref 0–5)

## 2022-02-04 LAB — URINALYSIS, ROUTINE W REFLEX MICROSCOPIC

## 2022-02-04 MED ORDER — NITROFURANTOIN MONOHYD MACRO 100 MG PO CAPS
100.0000 mg | ORAL_CAPSULE | Freq: Once | ORAL | Status: AC
  Administered 2022-02-04: 100 mg via ORAL
  Filled 2022-02-04: qty 1

## 2022-02-04 MED ORDER — NITROFURANTOIN MONOHYD MACRO 100 MG PO CAPS: 100.0000 mg | ORAL_CAPSULE | Freq: Two times a day (BID) | ORAL | 0 refills | Status: AC

## 2022-02-04 NOTE — Discharge Instructions (Signed)
Follow-up with your urologist when you return to [place]. ?

## 2022-02-04 NOTE — ED Triage Notes (Signed)
Pt presents with dysuria, hematuria, and frequency for 1 day. ?

## 2022-02-04 NOTE — ED Provider Notes (Signed)
?[facility] ?Provider Note ? ? ?CSN: [id number] ?Arrival date & time: 02/04/22  1317 ? ?  ? ?History ? ?Chief Complaint  ?Patient presents with  ? Hematuria  ? ? ?Cynthia Doyle is a 76 y.o. female who is on Eliquis for DVT presented to ED with hematuria and dysuria for 2 days.  She says this feels similar to UTIs she has had in the past about 2 years ago.  She has urinary frequency and painful urination.  She also notes frank blood in her urine.  She says that she lives in [place] typically, was planning to drive back tomorrow, her urologist instructed her to come have a "urine culture sent". ? ?HPI ? ?  ? ?Home Medications ?Prior to Admission medications   ?Medication Sig Start Date End Date Taking? Authorizing Provider  ?nitrofurantoin, macrocrystal-monohydrate, (MACROBID) 100 MG capsule Take 1 capsule (100 mg total) by mouth 2 (two) times daily for 5 days. 02/04/22 02/09/22 Yes Ulisses Vondrak, Carola Rhine, MD  ?gabapentin (NEURONTIN) 100 MG capsule Take 3 capsules (300 mg total) by mouth at bedtime. 01/18/14   Star Age, MD  ?lisinopril-hydrochlorothiazide (PRINZIDE,ZESTORETIC) 20-25 MG per tablet Take 1 tablet by mouth daily.    [provider]  ?   ? ?Allergies    ?Prednisone   ? ?Review of Systems   ?Review of Systems ? ?Physical Exam ?Updated Vital Signs ?BP (!) 152/79 (BP Location: Right Arm)   Pulse 92   Temp 99.2 ?F (37.3 ?C) (Oral)   Resp 18   Ht '4\' 8"'$  (1.422 m)   Wt 85.3 kg   SpO2 99%   BMI 42.15 kg/m?  ?Physical Exam ?Constitutional:   ?   General: She is not in acute distress. ?HENT:  ?   Head: Normocephalic and atraumatic.  ?Eyes:  ?   Conjunctiva/sclera: Conjunctivae normal.  ?   Pupils: Pupils are equal, round, and reactive to light.  ?Cardiovascular:  ?   Rate and Rhythm: Normal rate and regular rhythm.  ?Pulmonary:  ?   Effort: Pulmonary effort is normal. No respiratory distress.  ?Skin: ?   General: Skin is warm and dry.  ?Neurological:  ?   General: No focal deficit  present.  ?   Mental Status: She is alert. Mental status is at baseline.  ?Psychiatric:     ?   Mood and Affect: Mood normal.     ?   Behavior: Behavior normal.  ? ? ?ED Results / Procedures / Treatments   ?Labs ?(all labs ordered are listed, but only abnormal results are displayed) ?Labs Reviewed  ?URINALYSIS, ROUTINE W REFLEX MICROSCOPIC - Abnormal; Notable for the following components:  ?    Result Value  ? Color, Urine BROWN (*)   ? APPearance TURBID (*)   ? Glucose, UA   (*)   ? Value: TEST NOT REPORTED DUE TO COLOR INTERFERENCE OF URINE PIGMENT  ? Hgb urine dipstick   (*)   ? Value: TEST NOT REPORTED DUE TO COLOR INTERFERENCE OF URINE PIGMENT  ? Bilirubin Urine   (*)   ? Value: TEST NOT REPORTED DUE TO COLOR INTERFERENCE OF URINE PIGMENT  ? Ketones, ur   (*)   ? Value: TEST NOT REPORTED DUE TO COLOR INTERFERENCE OF URINE PIGMENT  ? Protein, ur   (*)   ? Value: TEST NOT REPORTED DUE TO COLOR INTERFERENCE OF URINE PIGMENT  ? Nitrite   (*)   ? Value: TEST NOT REPORTED DUE TO  COLOR INTERFERENCE OF URINE PIGMENT  ? Leukocytes,Ua   (*)   ? Value: TEST NOT REPORTED DUE TO COLOR INTERFERENCE OF URINE PIGMENT  ? All other components within normal limits  ?URINALYSIS, MICROSCOPIC (REFLEX) - Abnormal; Notable for the following components:  ? Bacteria, UA MANY (*)   ? All other components within normal limits  ?URINE CULTURE  ? ? ?EKG ?None ? ?Radiology ?No results found. ? ?Procedures ?Procedures  ? ? ?Medications Ordered in ED ?Medications  ?nitrofurantoin (macrocrystal-monohydrate) (MACROBID) capsule 100 mg (has no administration in time range)  ? ? ?ED Course/ Medical Decision Making/ A&P ?  ?                        ?Medical Decision Making ?Amount and/or Complexity of Data Reviewed ?Labs: ordered. ? ?Risk ?Prescription drug management. ? ? ?Patient is here with dysuria.  Differential diagnosis includes cystitis versus other UTI versus other dysuria versus kidney stone ? ?She is overall well-appearing, does not show  signs of sepsis, does not appear toxic.  I have a low suspicion for acute symptomatic anemia or blood loss anemia from this dysuria.  Her UA per my review shows frankly bloody urine sample, many bacteria and white blood cells, few squamous cells, I suspect this could be consistent with an infection.  We will send a urine culture, I will start her on macrobid. ? ?Lower suspicion for acute pyelonephritis at this time, no flank tenderness or pain, no fevers. ? ? ? ? ? ? ? ?Final Clinical Impression(s) / ED Diagnoses ?Final diagnoses:  ?Dysuria  ? ? ?Rx / DC Orders ?ED Discharge Orders   ? ?      Ordered  ?  nitrofurantoin, macrocrystal-monohydrate, (MACROBID) 100 MG capsule  2 times daily       ? 02/04/22 1638  ? ?  ?  ? ?  ? ? ?  ?Wyvonnia Dusky, MD ?02/04/22 1643 ? ?

## 2022-02-04 NOTE — ED Notes (Signed)
Pt NAD, a/ox4. Pt verbalizes understanding of all DC and f/u instructions. All questions answered. Pt walks with cane and steady gait to lobby at DC.  ? ?

## 2022-02-06 LAB — URINE CULTURE: Culture: >=100000 — AB

## 2022-02-07 ENCOUNTER — Telehealth: Payer: Self-pay | Admitting: *Deleted

## 2022-02-07 NOTE — Telephone Encounter (Signed)
Post ED Visit - Positive Culture Follow-up ? ?Culture report reviewed by antimicrobial stewardship pharmacist: ?[facility] ?'[]'$  Elenor Quinones, Pharm.D. ?'[]'$  Heide Guile, Pharm.D., BCPS AQ-ID ?'[]'$  Parks Neptune, Pharm.D., BCPS ?'[]'$  Alycia Rossetti, Pharm.D., BCPS ?'[]'$  [place], Pharm.D., BCPS, AAHIVP ?'[]'$  Legrand Como, Pharm.D., BCPS, AAHIVP ?'[]'$  Salome Arnt, PharmD, BCPS ?'[]'$  Johnnette Gourd, PharmD, BCPS ?'[]'$  Hughes Better, PharmD, BCPS ?'[]'$  Leeroy Cha, PharmD ?'[]'$  Laqueta Linden, PharmD, BCPS ?'[]'$  Albertina Parr, PharmD ? ?[facility] Team ?'[]'$  Leodis Sias, PharmD ?'[]'$  Lindell Spar, PharmD ?'[]'$  Royetta Asal, PharmD ?'[]'$  Graylin Shiver, Rph ?'[]'$  Rema Fendt) Glennon Mac, PharmD ?'[]'$  Arlyn Dunning, PharmD ?'[]'$  Netta Cedars, PharmD ?'[]'$  Dia Sitter, PharmD ?'[]'$  Leone Haven, PharmD ?'[]'$  Gretta Arab, PharmD ?'[]'$  Theodis Shove, PharmD ?'[]'$  Peggyann Juba, PharmD ?'[]'$  Reuel Boom, PharmD ? ? ?Positive urine culture ?Treated with Nitrofurantoin Nonohyd Macro, organism sensitive to the same and no further patient follow-up is required at this time.  Andreas Blower, PharmD ? ?Ardeen Fillers ?02/07/2022, 7:51 AM ?  ?

## 2023-11-16 IMAGING — US US EXTREM LOW VENOUS
1 series · 14 of 24 positions shown · non-contrast
Comparison: None.

CLINICAL DATA: Bilateral leg swelling and pain

EXAM:
Bilateral LOWER EXTREMITY VENOUS DOPPLER ULTRASOUND
TECHNIQUE: Gray-scale sonography with compression, as well as color and duplex
ultrasound, were performed to evaluate the deep venous system(s)
from the level of the common femoral vein through the popliteal and
proximal calf veins.

[Series 1: us venous img lower bilat (dvt) · portal-venous · 14 of 85 slices shown]
[im 1/85]
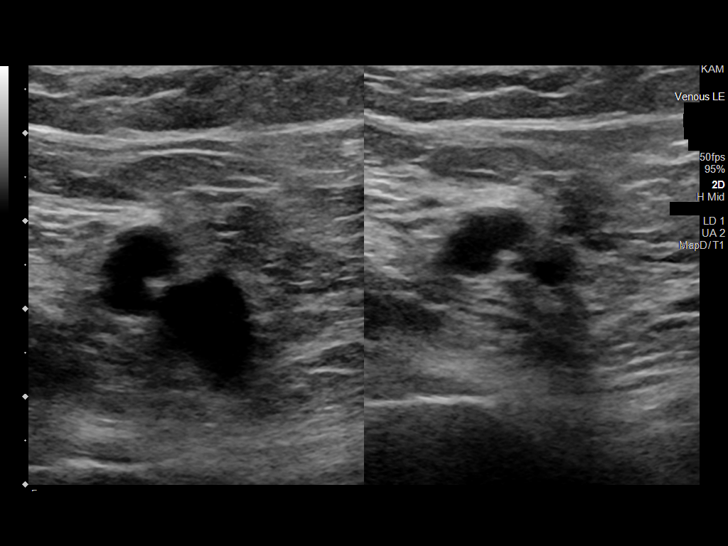
[im 8/85]
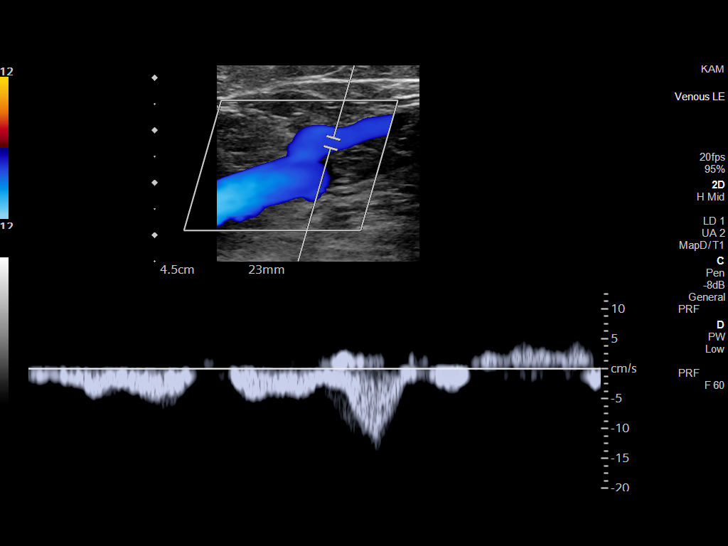
[im 15/85]
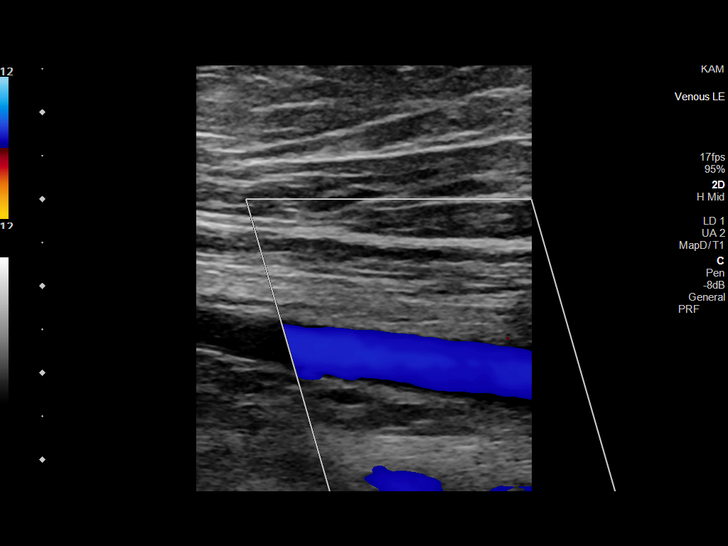
[im 22/85]
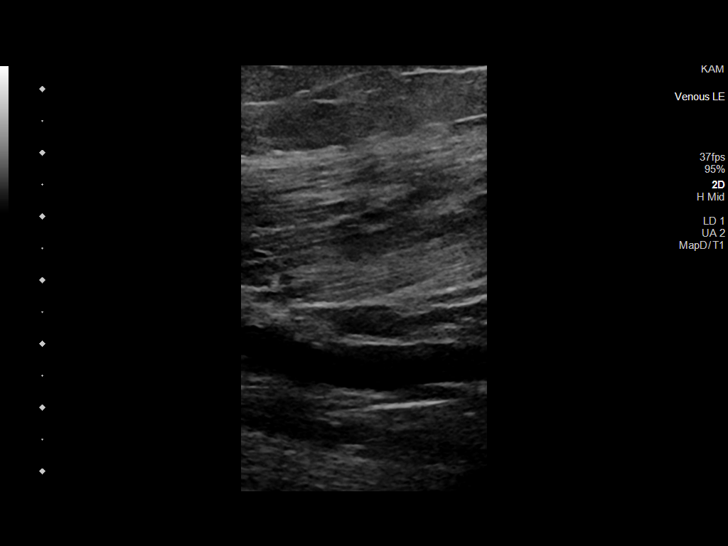
[im 26/85]
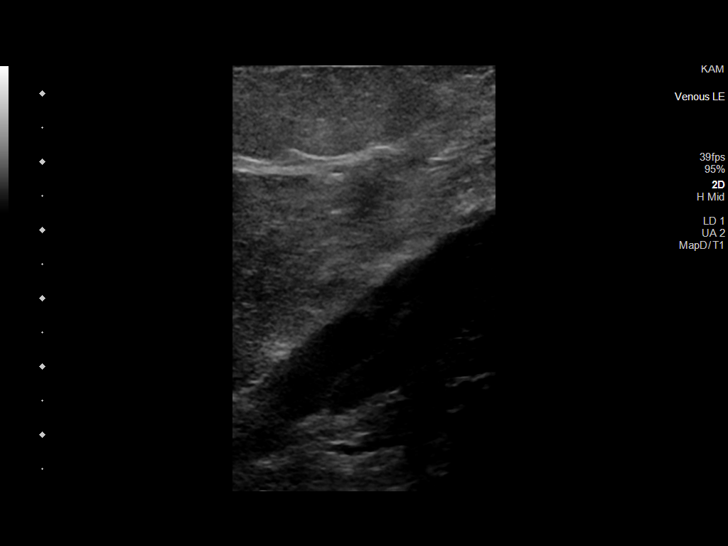
[im 33/85]
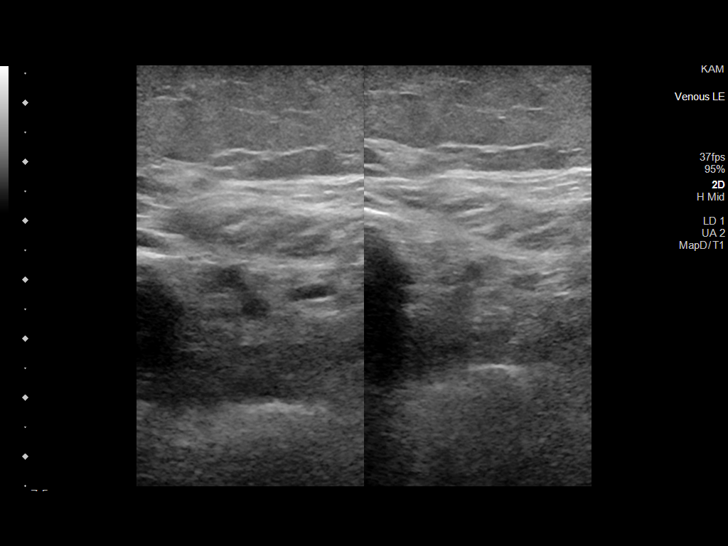
[im 41/85]
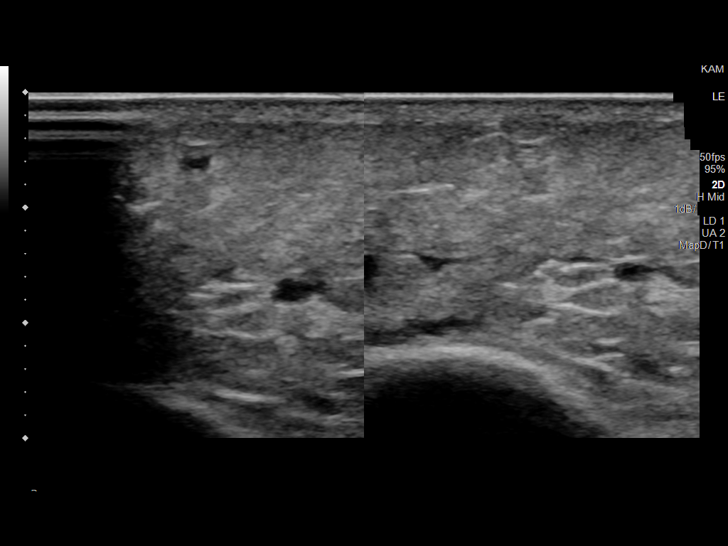
[im 44/85]
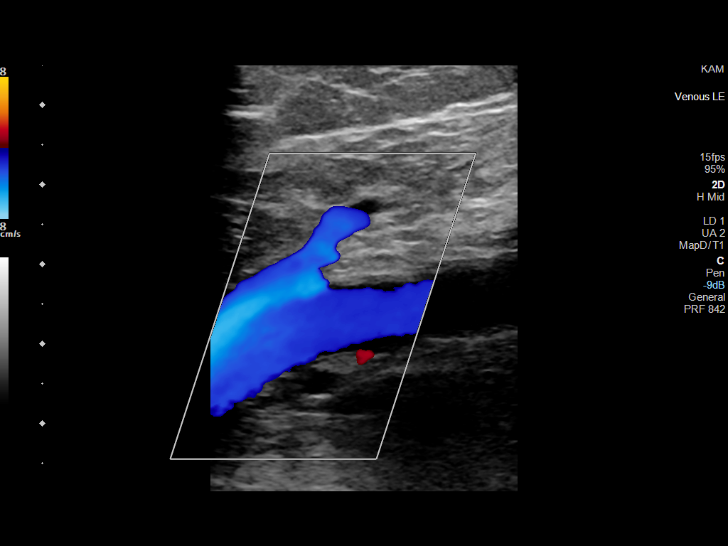
[im 52/85]
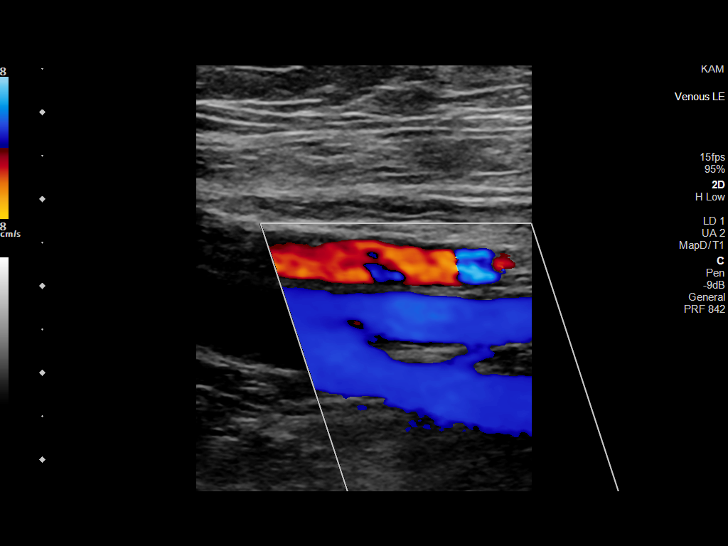
[im 59/85]
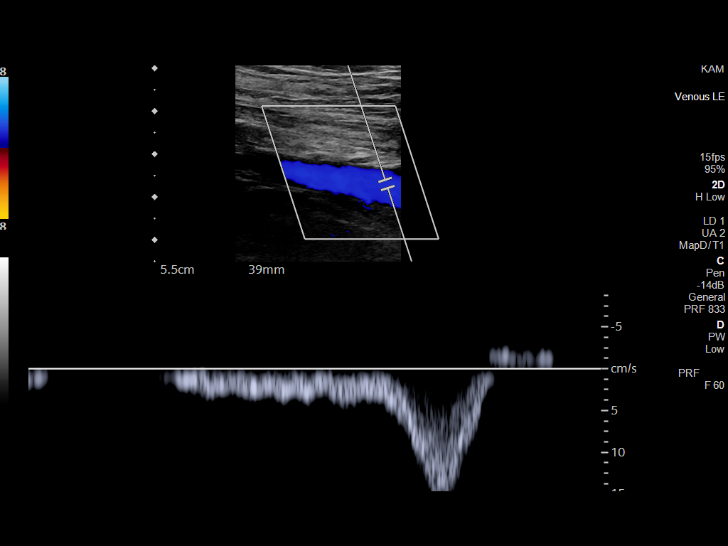
[im 66/85]
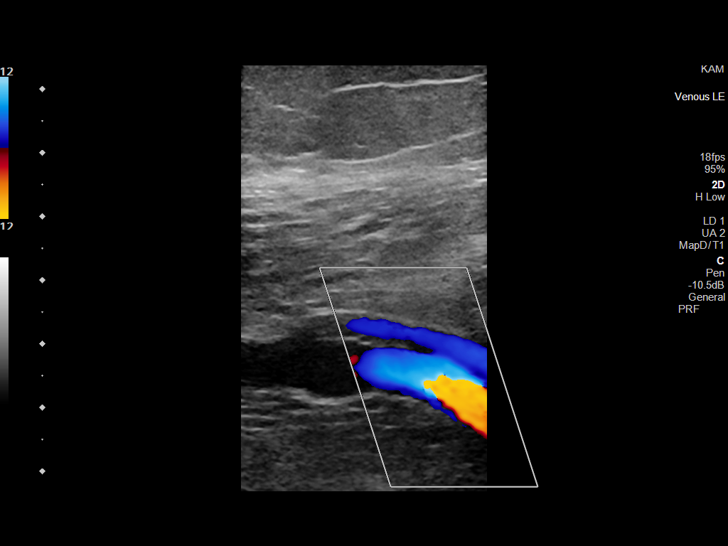
[im 70/85]
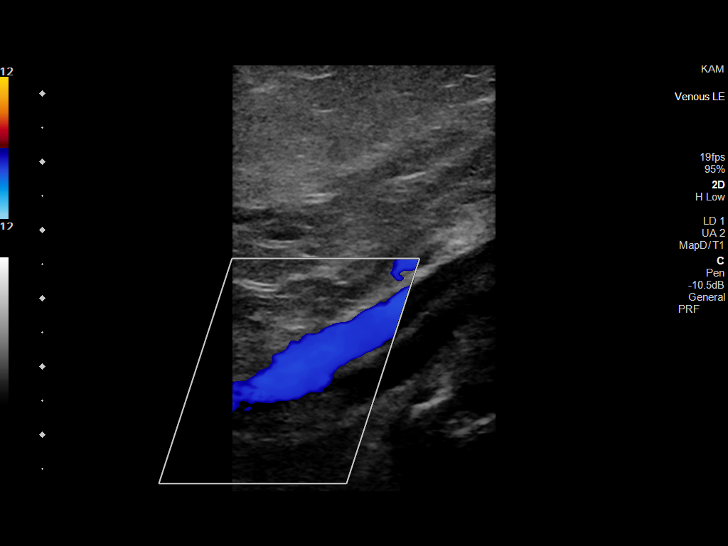
[im 77/85]
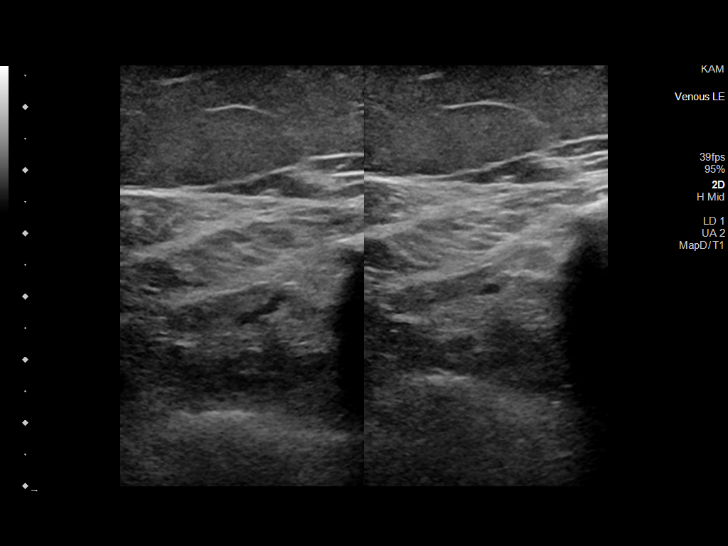
[im 85/85]
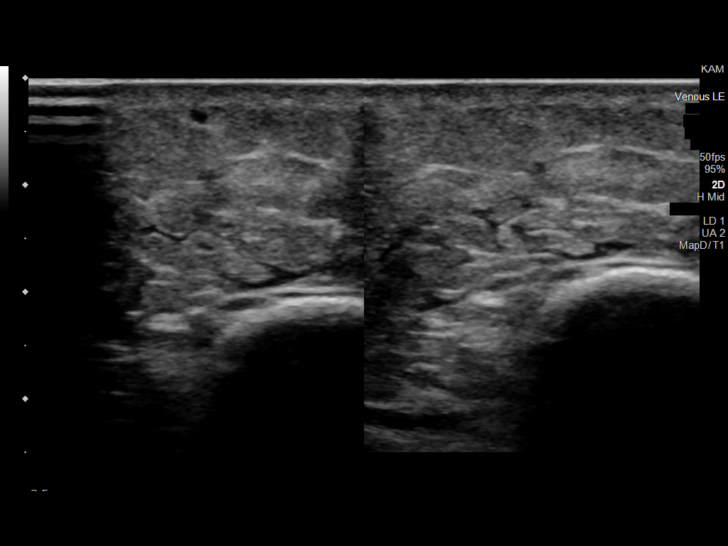

[14 of 24 positions shown; findings below may reference images not displayed]

FINDINGS: VENOUS

Normal compressibility of the common femoral, superficial femoral,
and popliteal veins, as well as the visualized calf veins.
Visualized portions of profunda femoral vein and great saphenous
vein unremarkable. No filling defects to suggest DVT on grayscale or
color Doppler imaging. Doppler waveforms show normal direction of
venous flow, normal respiratory plasticity and response to
augmentation.

OTHER

None.

Limitations: none
IMPRESSION: Negative.
